# Patient Record
Sex: Male | Born: 1965 | Race: White | Hispanic: No | Smoking: Never smoker
Health system: Southern US, Community
[De-identification: ages and names within clinical notes are randomized; demographics above are authoritative.]

## PROBLEM LIST (undated history)

## (undated) HISTORY — PX: BACK SURGERY: SHX140

---

## 1999-06-18 ENCOUNTER — Encounter: Payer: Self-pay | Admitting: Neurosurgery

## 1999-06-18 ENCOUNTER — Inpatient Hospital Stay (HOSPITAL_COMMUNITY): Admission: RE | Admit: 1999-06-18 | Discharge: 1999-06-21 | Payer: Self-pay | Admitting: Neurosurgery

## 2000-05-02 ENCOUNTER — Encounter: Payer: Self-pay | Admitting: Neurosurgery

## 2000-05-02 ENCOUNTER — Ambulatory Visit (HOSPITAL_COMMUNITY): Admission: RE | Admit: 2000-05-02 | Discharge: 2000-05-02 | Payer: Self-pay | Admitting: Neurosurgery

## 2004-12-15 ENCOUNTER — Ambulatory Visit: Payer: Self-pay | Admitting: Pain Medicine

## 2005-02-16 ENCOUNTER — Ambulatory Visit: Payer: Self-pay | Admitting: Pain Medicine

## 2006-01-11 ENCOUNTER — Ambulatory Visit: Payer: Self-pay | Admitting: Urology

## 2014-09-16 ENCOUNTER — Emergency Department: Payer: Self-pay | Admitting: Internal Medicine

## 2014-10-05 ENCOUNTER — Emergency Department: Payer: Self-pay | Admitting: Student

## 2014-10-05 LAB — COMPREHENSIVE METABOLIC PANEL
Albumin: 3.7 g/dL (ref 3.4–5.0)
Alkaline Phosphatase: 78 U/L
Anion Gap: 5 — ABNORMAL LOW (ref 7–16)
BUN: 11 mg/dL (ref 7–18)
Bilirubin,Total: 0.3 mg/dL (ref 0.2–1.0)
Calcium, Total: 8.5 mg/dL (ref 8.5–10.1)
Chloride: 105 mmol/L (ref 98–107)
Co2: 29 mmol/L (ref 21–32)
Creatinine: 1.09 mg/dL (ref 0.60–1.30)
EGFR (African American): >60
EGFR (Non-African Amer.): >60
Glucose: 96 mg/dL (ref 65–99)
Osmolality: 277 (ref 275–301)
Potassium: 3.6 mmol/L (ref 3.5–5.1)
SGOT(AST): 22 U/L (ref 15–37)
SGPT (ALT): 39 U/L
Sodium: 139 mmol/L (ref 136–145)
Total Protein: 6.6 g/dL (ref 6.4–8.2)

## 2014-10-05 LAB — CBC
HCT: 43.8 % (ref 40.0–52.0)
HGB: 14.4 g/dL (ref 13.0–18.0)
MCH: 30.7 pg (ref 26.0–34.0)
MCHC: 32.9 g/dL (ref 32.0–36.0)
MCV: 93 fL (ref 80–100)
Platelet: 192 10*3/uL (ref 150–440)
RBC: 4.69 10*6/uL (ref 4.40–5.90)
RDW: 13 % (ref 11.5–14.5)
WBC: 5 10*3/uL (ref 3.8–10.6)

## 2014-10-05 LAB — TROPONIN I: Troponin-I: <0.02 ng/mL
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
## 2016-03-01 ENCOUNTER — Encounter: Payer: Self-pay | Admitting: *Deleted

## 2016-03-17 ENCOUNTER — Ambulatory Visit: Payer: Self-pay | Admitting: General Surgery

## 2016-03-29 ENCOUNTER — Encounter: Payer: Self-pay | Admitting: General Surgery

## 2016-03-29 ENCOUNTER — Ambulatory Visit (INDEPENDENT_AMBULATORY_CARE_PROVIDER_SITE_OTHER): Payer: BC Managed Care – PPO | Admitting: General Surgery

## 2016-03-29 ENCOUNTER — Ambulatory Visit (INDEPENDENT_AMBULATORY_CARE_PROVIDER_SITE_OTHER): Payer: BC Managed Care – PPO

## 2016-03-29 ENCOUNTER — Ambulatory Visit
Admission: EM | Admit: 2016-03-29 | Discharge: 2016-03-29 | Disposition: A | Discharge status: Home / Self Care | Payer: BC Managed Care – PPO | Attending: Family Medicine | Admitting: Family Medicine

## 2016-03-29 ENCOUNTER — Encounter: Payer: Self-pay | Admitting: Emergency Medicine

## 2016-03-29 VITALS — BP 150/92 | HR 80 | Resp 14 | Ht 72.0 in | Wt 216.0 lb

## 2016-03-29 DIAGNOSIS — M19079 Primary osteoarthritis, unspecified ankle and foot: Secondary | ICD-10-CM

## 2016-03-29 DIAGNOSIS — M1990 Unspecified osteoarthritis, unspecified site: Secondary | ICD-10-CM

## 2016-03-29 DIAGNOSIS — M7662 Achilles tendinitis, left leg: Secondary | ICD-10-CM

## 2016-03-29 DIAGNOSIS — M7732 Calcaneal spur, left foot: Secondary | ICD-10-CM | POA: Diagnosis not present

## 2016-03-29 DIAGNOSIS — Z1211 Encounter for screening for malignant neoplasm of colon: Secondary | ICD-10-CM

## 2016-03-29 MED ORDER — ACETAMINOPHEN 500 MG PO TABS: 1000.0000 mg | ORAL_TABLET | Freq: Four times a day (QID) | ORAL | Status: AC | PRN

## 2016-03-29 MED ORDER — POLYETHYLENE GLYCOL 3350 17 GM/SCOOP PO POWD: ORAL | Status: DC

## 2016-03-29 NOTE — Patient Instructions (Addendum)
Colonoscopy A colonoscopy is an exam to look at the entire large intestine (colon). This exam can help find problems such as tumors, polyps, inflammation, and areas of bleeding. The exam takes about 1 hour.  [facility] KNOW ABOUT:   Any allergies you have.  All medicines you are taking, including vitamins, herbs, eye drops, creams, and over-the-counter medicines.  Previous problems you or members of your family have had with the use of anesthetics.  Any blood disorders you have.  Previous surgeries you have had.  Medical conditions you have. RISKS AND COMPLICATIONS  Generally, this is a safe procedure. However, as with any procedure, complications can occur. Possible complications include:  Bleeding.  Tearing or rupture of the colon wall.  Reaction to medicines given during the exam.  Infection (rare). BEFORE THE PROCEDURE   Ask your health care provider about changing or stopping your regular medicines.  You may be prescribed an oral bowel prep. This involves drinking a large amount of medicated liquid, starting the day before your procedure. The liquid will cause you to have multiple loose stools until your stool is almost clear or light green. This cleans out your colon in preparation for the procedure.  Do not eat or drink anything else once you have started the bowel prep, unless your health care provider tells you it is safe to do so.  Arrange for someone to drive you home after the procedure. PROCEDURE   You will be given medicine to help you relax (sedative).  You will lie on your side with your knees bent.  A long, flexible tube with a light and camera on the end (colonoscope) will be inserted through the rectum and into the colon. The camera sends video back to a computer screen as it moves through the colon. The colonoscope also releases carbon dioxide gas to inflate the colon. This helps your health care provider see the area better.  During  the exam, your health care provider may take a small tissue sample (biopsy) to be examined under a microscope if any abnormalities are found.  The exam is finished when the entire colon has been viewed. AFTER THE PROCEDURE   Do not drive for 24 hours after the exam.  You may have a small amount of blood in your stool.  You may pass moderate amounts of gas and have mild abdominal cramping or bloating. This is caused by the gas used to inflate your colon during the exam.  Ask when your test results will be ready and how you will get your results. Make sure you get your test results.   This information is not intended to replace advice given to you by your health care provider. Make sure you discuss any questions you have with your health care provider.   Document Released: 10/22/2000 Document Revised: 08/15/2013 Document Reviewed: 07/02/2013 [employer] Interactive Patient Education [employer]  [facility].

## 2016-03-29 NOTE — Discharge Instructions (Signed)
Achilles Tendinitis Achilles tendinitis is inflammation of the tough, cord-like band that attaches the lower muscles of your leg to your heel (Achilles tendon). It is usually caused by overusing the tendon and joint involved.  CAUSES Achilles tendinitis can happen because of:  A sudden increase in exercise or activity (such as running).  Doing the same exercises or activities (such as jumping) over and over.  Not warming up calf muscles before exercising.  Exercising in shoes that are worn out or not made for exercise.  Having arthritis or a bone growth on the back of the heel bone. This can rub against the tendon and hurt the tendon. SIGNS AND SYMPTOMS The most common symptoms are:  Pain in the back of the leg, just above the heel. The pain usually gets worse with exercise and better with rest.  Stiffness or soreness in the back of the leg, especially in the morning.  Swelling of the skin over the Achilles tendon.  Trouble standing on tiptoe. Sometimes, an Achilles tendon tears (ruptures). Symptoms of an Achilles tendon rupture can include:  Sudden, severe pain in the back of the leg.  Trouble putting weight on the foot or walking normally. DIAGNOSIS Achilles tendinitis will be diagnosed based on symptoms and a physical examination. An X-ray may be done to check if another condition is causing your symptoms. An MRI may be ordered if your health care provider suspects you may have completely torn your tendon, which is called an Achilles tendon rupture.  TREATMENT  Achilles tendinitis usually gets better over time. It can take weeks to months to heal completely. Treatment focuses on treating the symptoms and helping the injury heal. HOME CARE INSTRUCTIONS   Rest your Achilles tendon and avoid activities that cause pain.  Apply ice to the injured area:  Put ice in a plastic bag.  Place a towel between your skin and the bag.  Leave the ice on for 20 minutes, 2-3 times a  day  Try to avoid using the tendon (other than gentle range of motion) while the tendon is painful. Do not resume use until instructed by your health care provider. Then begin use gradually. Do not increase use to the point of pain. If pain does develop, decrease use and continue the above measures. Gradually increase activities that do not cause discomfort until you achieve normal use.  Do exercises to make your calf muscles stronger and more flexible. Your health care provider or physical therapist can recommend exercises for you to do.  Wrap your ankle with an elastic bandage or other wrap. This can help keep your tendon from moving too much. Your health care provider will show you how to wrap your ankle correctly.  Only take over-the-counter or prescription medicines for pain, discomfort, or fever as directed by your health care provider. SEEK MEDICAL CARE IF:   Your pain and swelling increase or pain is uncontrolled with medicines.  You develop new, unexplained symptoms or your symptoms get worse.  You are unable to move your toes or foot.  You develop warmth and swelling in your foot.  You have an unexplained temperature. MAKE SURE YOU:   Understand these instructions.  Will watch your condition.  Will get help right away if you are not doing well or get worse.   This information is not intended to replace advice given to you by your health care provider. Make sure you discuss any questions you have with your health care provider.   Document Released:  08/04/2005 Document Revised: 11/15/2014 Document Reviewed: 06/06/2013 Elsevier Interactive Patient Education 2016 [employer]  Achilles Tendinitis With Rehab Achilles tendinitis is a disorder of the Achilles tendon. The Achilles tendon connects the large calf muscles (Gastrocnemius and Soleus) to the heel bone (calcaneus). This tendon is sometimes called the heel cord. It is important for pushing-off and standing on your toes  and is important for walking, running, or jumping. Tendinitis is often caused by overuse and repetitive microtrauma. SYMPTOMS  Pain, tenderness, swelling, warmth, and redness may occur over the Achilles tendon even at rest.  Pain with pushing off, or flexing or extending the ankle.  Pain that is worsened after or during activity. CAUSES   Overuse sometimes seen with rapid increase in exercise programs or in sports requiring running and jumping.  Poor physical conditioning (strength and flexibility or endurance).  Running sports, especially training running down hills.  Inadequate warm-up before practice or play or failure to stretch before participation.  Injury to the tendon. PREVENTION   Warm up and stretch before practice or competition.  Allow time for adequate rest and recovery between practices and competition.  Keep up conditioning.  Keep up ankle and leg flexibility.  Improve or keep muscle strength and endurance.  Improve cardiovascular fitness.  Use proper technique.  Use proper equipment (shoes, skates).  To help prevent recurrence, taping, protective strapping, or an adhesive bandage may be recommended for several weeks after healing is complete. PROGNOSIS   Recovery may take weeks to several months to heal.  Longer recovery is expected if symptoms have been prolonged.  Recovery is usually quicker if the inflammation is due to a direct blow as compared with overuse or sudden strain. RELATED COMPLICATIONS   Healing time will be prolonged if the condition is not correctly treated. The injury must be given plenty of time to heal.  Symptoms can reoccur if activity is resumed too soon.  Untreated, tendinitis may increase the risk of tendon rupture requiring additional time for recovery and possibly surgery. TREATMENT   The first treatment consists of rest anti-inflammatory medication, and ice to relieve the pain.  Stretching and strengthening exercises  after resolution of pain will likely help reduce the risk of recurrence. Referral to a physical therapist or athletic trainer for further evaluation and treatment may be helpful.  A walking boot or cast may be recommended to rest the Achilles tendon. This can help break the cycle of inflammation and microtrauma.  Arch supports (orthotics) may be prescribed or recommended by your caregiver as an adjunct to therapy and rest.  Surgery to remove the inflamed tendon lining or degenerated tendon tissue is rarely necessary and has shown less than predictable results. MEDICATION   Nonsteroidal anti-inflammatory medications, such as aspirin and ibuprofen, may be used for pain and inflammation relief. Do not take within 7 days before surgery. Take these as directed by your caregiver. Contact your caregiver immediately if any bleeding, stomach upset, or signs of allergic reaction occur. Other minor pain relievers, such as acetaminophen, may also be used.  Pain relievers may be prescribed as necessary by your caregiver. Do not take prescription pain medication for longer than 4 to 7 days. Use only as directed and only as much as you need.  Cortisone injections are rarely indicated. Cortisone injections may weaken tendons and predispose to rupture. It is better to give the condition more time to heal than to use them. HEAT AND COLD  Cold is used to relieve pain and reduce inflammation  for acute and chronic Achilles tendinitis. Cold should be applied for 10 to 15 minutes every 2 to 3 hours for inflammation and pain and immediately after any activity that aggravates your symptoms. Use ice packs or an ice massage.  Heat may be used before performing stretching and strengthening activities prescribed by your caregiver. Use a heat pack or a warm soak. SEEK MEDICAL CARE IF:  Symptoms get worse or do not improve in 2 weeks despite treatment.  New, unexplained symptoms develop. Drugs used in treatment may produce  side effects. EXERCISES RANGE OF MOTION (ROM) AND STRETCHING EXERCISES - Achilles Tendinitis  These exercises may help you when beginning to rehabilitate your injury. Your symptoms may resolve with or without further involvement from your physician, physical therapist or athletic trainer. While completing these exercises, remember:   Restoring tissue flexibility helps normal motion to return to the joints. This allows healthier, less painful movement and activity.  An effective stretch should be held for at least 30 seconds.  A stretch should never be painful. You should only feel a gentle lengthening or release in the stretched tissue. STRETCH - Gastroc, Standing   Place hands on wall.  Extend right / left leg, keeping the front knee somewhat bent.  Slightly point your toes inward on your back foot.  Keeping your right / left heel on the floor and your knee straight, shift your weight toward the wall, not allowing your back to arch.  You should feel a gentle stretch in the right / left calf. Hold this position for __________ seconds. Repeat __________ times. Complete this stretch __________ times per day. STRETCH - Soleus, Standing   Place hands on wall.  Extend right / left leg, keeping the other knee somewhat bent.  Slightly point your toes inward on your back foot.  Keep your right / left heel on the floor, bend your back knee, and slightly shift your weight over the back leg so that you feel a gentle stretch deep in your back calf.  Hold this position for __________ seconds. Repeat __________ times. Complete this stretch __________ times per day. STRETCH - Gastrocsoleus, Standing  Note: This exercise can place a lot of stress on your foot and ankle. Please complete this exercise only if specifically instructed by your caregiver.   Place the ball of your right / left foot on a step, keeping your other foot firmly on the same step.  Hold on to the wall or a rail for  balance.  Slowly lift your other foot, allowing your body weight to press your heel down over the edge of the step.  You should feel a stretch in your right / left calf.  Hold this position for __________ seconds.  Repeat this exercise with a slight bend in your knee. Repeat __________ times. Complete this stretch __________ times per day.  STRENGTHENING EXERCISES - Achilles Tendinitis These exercises may help you when beginning to rehabilitate your injury. They may resolve your symptoms with or without further involvement from your physician, physical therapist or athletic trainer. While completing these exercises, remember:   Muscles can gain both the endurance and the strength needed for everyday activities through controlled exercises.  Complete these exercises as instructed by your physician, physical therapist or athletic trainer. Progress the resistance and repetitions only as guided.  You may experience muscle soreness or fatigue, but the pain or discomfort you are trying to eliminate should never worsen during these exercises. If this pain does worsen, stop and  make certain you are following the directions exactly. If the pain is still present after adjustments, discontinue the exercise until you can discuss the trouble with your clinician. STRENGTH - Plantar-flexors   Sit with your right / left leg extended. Holding onto both ends of a rubber exercise band/tubing, loop it around the ball of your foot. Keep a slight tension in the band.  Slowly push your toes away from you, pointing them downward.  Hold this position for __________ seconds. Return slowly, controlling the tension in the band/tubing. Repeat __________ times. Complete this exercise __________ times per day.  STRENGTH - Plantar-flexors   Stand with your feet shoulder width apart. Steady yourself with a wall or table using as little support as needed.  Keeping your weight evenly spread over the width of your feet,  rise up on your toes.*  Hold this position for __________ seconds. Repeat __________ times. Complete this exercise __________ times per day.  *If this is too easy, shift your weight toward your right / left leg until you feel challenged. Ultimately, you may be asked to do this exercise with your right / left foot only. STRENGTH - Plantar-flexors, Eccentric  Note: This exercise can place a lot of stress on your foot and ankle. Please complete this exercise only if specifically instructed by your caregiver.   Place the balls of your feet on a step. With your hands, use only enough support from a wall or rail to keep your balance.  Keep your knees straight and rise up on your toes.  Slowly shift your weight entirely to your right / left toes and pick up your opposite foot. Gently and with controlled movement, lower your weight through your right / left foot so that your heel drops below the level of the step. You will feel a slight stretch in the back of your calf at the end position.  Use the healthy leg to help rise up onto the balls of both feet, then lower weight only on the right / left leg again. Build up to 15 repetitions. Then progress to 3 consecutive sets of 15 repetitions.*  After completing the above exercise, complete the same exercise with a slight knee bend (about 30 degrees). Again, build up to 15 repetitions. Then progress to 3 consecutive sets of 15 repetitions.* Perform this exercise __________ times per day.  *When you easily complete 3 sets of 15, your physician, physical therapist or athletic trainer may advise you to add resistance by wearing a backpack filled with additional weight. STRENGTH - Plantar Flexors, Seated   Sit on a chair that allows your feet to rest flat on the ground. If necessary, sit at the edge of the chair.  Keeping your toes firmly on the ground, lift your right / left heel as far as you can without increasing any discomfort in your ankle. Repeat  __________ times. Complete this exercise __________ times a day. *If instructed by your physician, physical therapist or athletic trainer, you may add ____________________ of resistance by placing a weighted object on your right / left knee.   This information is not intended to replace advice given to you by your health care provider. Make sure you discuss any questions you have with your health care provider.   Document Released: 05/26/2005 Document Revised: 11/15/2014 Document Reviewed: 02/06/2009 Elsevier Interactive Patient Education 2016 [employer] Arthritis Arthritis is a term that is commonly used to refer to joint pain or joint disease. There are more than 100 types  of arthritis. CAUSES The most common cause of this condition is wear and tear of a joint. Other causes include:  Gout.  Inflammation of a joint.  An infection of a joint.  Sprains and other injuries near the joint.  A drug reaction or allergic reaction. In some cases, the cause may not be known. SYMPTOMS The main symptom of this condition is pain in the joint with movement. Other symptoms include:  Redness, swelling, or stiffness at a joint.  Warmth coming from the joint.  Fever.  Overall feeling of illness. DIAGNOSIS This condition may be diagnosed with a physical exam and tests, including:  Blood tests.  Urine tests.  Imaging tests, such as MRI, X-rays, or a CT scan. Sometimes, fluid is removed from a joint for testing. TREATMENT Treatment for this condition may involve:  Treatment of the cause, if it is known.  Rest.  Raising (elevating) the joint.  Applying cold or hot packs to the joint.  Medicines to improve symptoms and reduce inflammation.  Injections of a steroid such as cortisone into the joint to help reduce pain and inflammation. Depending on the cause of your arthritis, you may need to make lifestyle changes to reduce stress on your joint. These changes may include  exercising more and losing weight. HOME CARE INSTRUCTIONS Medicines  Take over-the-counter and prescription medicines only as told by your health care provider.  Do not take aspirin to relieve pain if gout is suspected. Activities  Rest your joint if told by your health care provider. Rest is important when your disease is active and your joint feels painful, swollen, or stiff.  Avoid activities that make the pain worse. It is important to balance activity with rest.  Exercise your joint regularly with range-of-motion exercises as told by your health care provider. Try doing low-impact exercise, such as:  Swimming.  Water aerobics.  Biking.  Walking. Joint Care  If your joint is swollen, keep it elevated if told by your health care provider.  If your joint feels stiff in the morning, try taking a warm shower.  If directed, apply heat to the joint. If you have diabetes, do not apply heat without permission from your health care provider.  Put a towel between the joint and the hot pack or heating pad.  Leave the heat on the area for 20-30 minutes.  If directed, apply ice to the joint:  Put ice in a plastic bag.  Place a towel between your skin and the bag.  Leave the ice on for 20 minutes, 2-3 times per day.  Keep all follow-up visits as told by your health care provider. This is important. SEEK MEDICAL CARE IF:  The pain gets worse.  You have a fever. SEEK IMMEDIATE MEDICAL CARE IF:  You develop severe joint pain, swelling, or redness.  Many joints become painful and swollen.  You develop severe back pain.  You develop severe weakness in your leg.  You cannot control your bladder or bowels.   This information is not intended to replace advice given to you by your health care provider. Make sure you discuss any questions you have with your health care provider.   Document Released: 12/02/2004 Document Revised: 07/16/2015 Document Reviewed:  01/20/2015 Elsevier Interactive Patient Education 2016 [employer] Heel Spur A heel spur is a bony growth that forms on the bottom of your heel bone (calcaneus). Heel spurs are common and do not always cause pain. However, heel spurs often cause inflammation in the  strong band of tissue that runs underneath the bone of your foot (plantar fascia). When this happens, you may feel pain on the bottom of your foot, near your heel.  CAUSES  The cause of heel spurs is not completely understood. They may be caused by pressure on the heel. Or, they may stem from the muscle attachments (tendons) near the spur pulling on the heel.  RISK FACTORS You may be at risk for a heel spur if you:  Are older than 40.  Are overweight.  Have wear and tear arthritis (osteoarthritis).  Have plantar fascia inflammation. SIGNS AND SYMPTOMS  Some people have heel spurs but no symptoms. If you do have symptoms, they may include:   Pain in the bottom of your heel.  Pain that is worse when you first get out of bed.  Pain that gets worse after walking or standing. DIAGNOSIS  Your health care provider may diagnose a heel spur based on your symptoms and a physical exam. You may also have an X-ray of your foot to check for a bony growth coming from the calcaneus.  TREATMENT Treatment aims to relieve the pain from the heel spur. This may include:  Stretching exercises.  Losing weight.  Wearing specific shoes, inserts, or orthotics for comfort and support.  Wearing splints at night to properly position your feet.  Taking over-the-counter medicine to relieve pain.  Being treated with high-intensity sound waves to break up the heel spur (extracorporeal shock wave therapy).  Getting steroid injections in your heel to reduce swelling and ease pain.  Having surgery if your heel spur causes long-term (chronic) pain. HOME CARE INSTRUCTIONS   Take medicines only as directed by your health care provider.  Ask  your health care provider if you should use ice or cold packs on the painful areas of your heel or foot.  Avoid activities that cause you pain until you recover or as directed by your health care provider.  Stretch before exercising or being physically active.  Wear supportive shoes that fit well as directed by your health care provider. You might need to buy new shoes. Wearing old shoes or shoes that do not fit correctly may not provide the support that you need.  Lose weight if your health care provider thinks you should. This can relieve pressure on your foot that may be causing pain and discomfort. SEEK MEDICAL CARE IF:   Your pain continues or gets worse.   This information is not intended to replace advice given to you by your health care provider. Make sure you discuss any questions you have with your health care provider.   Document Released: 12/01/2005 Document Revised: 11/15/2014 Document Reviewed: 12/26/2013 Elsevier Interactive Patient Education 2016 [employer] Cryotherapy Cryotherapy means treatment with cold. Ice or gel packs can be used to reduce both pain and swelling. Ice is the most helpful within the first 24 to 48 hours after an injury or flare-up from overusing a muscle or joint. Sprains, strains, spasms, burning pain, shooting pain, and aches can all be eased with ice. Ice can also be used when recovering from surgery. Ice is effective, has very few side effects, and is safe for most people to use. PRECAUTIONS  Ice is not a safe treatment option for people with:  Raynaud phenomenon. This is a condition affecting small blood vessels in the extremities. Exposure to cold may cause your problems to return.  Cold hypersensitivity. There are many forms of cold hypersensitivity, including:  Cold urticaria.  Red, itchy hives appear on the skin when the tissues begin to warm after being iced.  Cold erythema. This is a red, itchy rash caused by exposure to cold.  Cold  hemoglobinuria. Red blood cells break down when the tissues begin to warm after being iced. The hemoglobin that carry oxygen are passed into the urine because they cannot combine with blood proteins fast enough.  Numbness or altered sensitivity in the area being iced. If you have any of the following conditions, do not use ice until you have discussed cryotherapy with your caregiver:  Heart conditions, such as arrhythmia, angina, or chronic heart disease.  High blood pressure.  Healing wounds or open skin in the area being iced.  Current infections.  Rheumatoid arthritis.  Poor circulation.  Diabetes. Ice slows the blood flow in the region it is applied. This is beneficial when trying to stop inflamed tissues from spreading irritating chemicals to surrounding tissues. However, if you expose your skin to cold temperatures for too long or without the proper protection, you can damage your skin or nerves. Watch for signs of skin damage due to cold. HOME CARE INSTRUCTIONS Follow these tips to use ice and cold packs safely.  Place a dry or damp towel between the ice and skin. A damp towel will cool the skin more quickly, so you may need to shorten the time that the ice is used.  For a more rapid response, add gentle compression to the ice.  Ice for no more than 10 to 20 minutes at a time. The bonier the area you are icing, the less time it will take to get the benefits of ice.  Check your skin after 5 minutes to make sure there are no signs of a poor response to cold or skin damage.  Rest 20 minutes or more between uses.  Once your skin is numb, you can end your treatment. You can test numbness by very lightly touching your skin. The touch should be so light that you do not see the skin dimple from the pressure of your fingertip. When using ice, most people will feel these normal sensations in this order: cold, burning, aching, and numbness.  Do not use ice on someone who cannot  communicate their responses to pain, such as small children or people with dementia. HOW TO MAKE AN ICE PACK Ice packs are the most common way to use ice therapy. Other methods include ice massage, ice baths, and cryosprays. Muscle creams that cause a cold, tingly feeling do not offer the same benefits that ice offers and should not be used as a substitute unless recommended by your caregiver. To make an ice pack, do one of the following:  Place crushed ice or a bag of frozen vegetables in a sealable plastic bag. Squeeze out the excess air. Place this bag inside another plastic bag. Slide the bag into a pillowcase or place a damp towel between your skin and the bag.  Mix 3 parts water with 1 part rubbing alcohol. Freeze the mixture in a sealable plastic bag. When you remove the mixture from the freezer, it will be slushy. Squeeze out the excess air. Place this bag inside another plastic bag. Slide the bag into a pillowcase or place a damp towel between your skin and the bag. SEEK MEDICAL CARE IF:  You develop white spots on your skin. This may give the skin a blotchy (mottled) appearance.  Your skin turns blue or pale.  Your skin becomes waxy  or hard.  Your swelling gets worse. MAKE SURE YOU:   Understand these instructions.  Will watch your condition.  Will get help right away if you are not doing well or get worse.   This information is not intended to replace advice given to you by your health care provider. Make sure you discuss any questions you have with your health care provider.   Document Released: 06/21/2011 Document Revised: 11/15/2014 Document Reviewed: 06/21/2011 Elsevier Interactive Patient Education 2016 Elsevier Inc. Medial Epicondylitis With Rehab Medial epicondylitis involves inflammation and pain around the inner (medial) portion of the elbow. This pain is caused by inflammation of the tendons in the forearm that flex (bring down) the wrist. Medial epicondylitis is  also called golfer's elbow, because it is common among golfers. However, it may occur in any individual who flexes the wrist regularly. If medial epicondylitis is left untreated, it may become a chronic problem. SYMPTOMS   Pain, tenderness, or inflammation over the inner (medial) side of the elbow.  Pain or weakness with gripping activities.  Pain that increases with wrist twisting motions (using a screwdriver, playing golf, bowling). CAUSES  Medial epicondylitis is caused by inflammation of the tendons that flex the wrist. Causes of injury may include:  Chronic, repetitive stress and strain to the tendons that run from the wrist and forearm to the elbow.  Sudden strain on the forearm, including wrist snap when serving balls with racquet sports, or throwing a baseball. RISK INCREASES WITH:  Sports or occupations that require repetitive and/or strenuous forearm and wrist movements (pitching a baseball, golfing, carpentry).  Poor wrist and forearm strength and flexibility.  Failure to warm up properly before activity.  Resuming activity before healing, rehabilitation, and conditioning are complete. PREVENTION   Warm up and stretch properly before activity.  Maintain physical fitness:  Strength, flexibility, and endurance.  Cardiovascular fitness.  Wear and use properly fitted equipment.  Learn and use proper technique and have a coach correct improper technique.  Wear a tennis elbow (counterforce) brace. PROGNOSIS  The course of this condition depends on the degree of the injury. If treated properly, acute cases (symptoms lasting less than 4 weeks) are often resolved in 2 to 6 weeks. Chronic (longer lasting cases) often resolve in 3 to 6 months, but may require physical therapy. RELATED COMPLICATIONS   Frequently recurring symptoms, resulting in a chronic problem. Properly treating the problem the first time decreases frequency of recurrence.  Chronic inflammation, scarring,  and partial tendon tear, requiring surgery.  Delayed healing or resolution of symptoms. TREATMENT  Treatment first involves the use of ice and medicine, to reduce pain and inflammation. Strengthening and stretching exercises may reduce discomfort, if performed regularly. These exercises may be performed at home, if the condition is an acute injury. Chronic cases may require a referral to a physical therapist for evaluation and treatment. Your caregiver may advise a corticosteroid injection to help reduce inflammation. Rarely, surgery is needed. MEDICATION  If pain medicine is needed, nonsteroidal anti-inflammatory medicines (aspirin and ibuprofen), or other minor pain relievers (acetaminophen), are often advised.  Do not take pain medicine for 7 days before surgery.  Prescription pain relievers may be given, if your caregiver thinks they are needed. Use only as directed and only as much as you need.  Corticosteroid injections may be recommended. These injections should be reserved only for the most severe cases, because they can only be given a certain number of times. HEAT AND COLD  Cold treatment (icing)  should be applied for 10 to 15 minutes every 2 to 3 hours for inflammation and pain, and immediately after activity that aggravates your symptoms. Use ice packs or an ice massage.  Heat treatment may be used before performing stretching and strengthening activities prescribed by your caregiver, physical therapist, or athletic trainer. Use a heat pack or a warm water soak. SEEK MEDICAL CARE IF: Symptoms get worse or do not improve in 2 weeks, despite treatment. EXERCISES  RANGE OF MOTION (ROM) AND STRETCHING EXERCISES - Epicondylitis, Medial (Golfer's Elbow) These exercises may help you when beginning to rehabilitate your injury. Your symptoms may go away with or without further involvement from your physician, physical therapist or athletic trainer. While completing these exercises,  remember:   Restoring tissue flexibility helps normal motion to return to the joints. This allows healthier, less painful movement and activity.  An effective stretch should be held for at least 30 seconds.  A stretch should never be painful. You should only feel a gentle lengthening or release in the stretched tissue. RANGE OF MOTION - Wrist Flexion, Active-Assisted  Extend your right / left elbow with your fingers pointing down.*  Gently pull the back of your hand towards you, until you feel a gentle stretch on the top of your forearm.  Hold this position for __________ seconds. Repeat __________ times. Complete this exercise __________ times per day.  *If directed by your physician, physical therapist or athletic trainer, complete this stretch with your elbow bent, rather than extended. RANGE OF MOTION - Wrist Extension, Active-Assisted  Extend your right / left elbow and turn your palm upwards.*  Gently pull your palm and fingertips back, so your wrist extends and your fingers point more toward the ground.  You should feel a gentle stretch on the inside of your forearm.  Hold this position for __________ seconds. Repeat __________ times. Complete this exercise __________ times per day. *If directed by your physician, physical therapist or athletic trainer, complete this stretch with your elbow bent, rather than extended. STRETCH - Wrist Extension   Place your right / left fingertips on a tabletop leaving your elbow slightly bent. Your fingers should point backwards.  Gently press your fingers and palm down onto the table, by straightening your elbow. You should feel a stretch on the inside of your forearm.  Hold this position for __________ seconds. Repeat __________ times. Complete this stretch __________ times per day.  STRENGTHENING EXERCISES - Epicondylitis, Medial (Golfer's Elbow) These exercises may help you when beginning to rehabilitate your injury. They may resolve  your symptoms with or without further involvement from your physician, physical therapist or athletic trainer. While completing these exercises, remember:   Muscles can gain both the endurance and the strength needed for everyday activities through controlled exercises.  Complete these exercises as instructed by your physician, physical therapist or athletic trainer. Increase the resistance and repetitions only as guided.  You may experience muscle soreness or fatigue, but the pain or discomfort you are trying to eliminate should never worsen during these exercises. If this pain does get worse, stop and make sure you are following the directions exactly. If the pain is still present after adjustments, discontinue the exercise until you can discuss the trouble with your caregiver. STRENGTH - Wrist Flexors  Sit with your right / left forearm palm-up, and fully supported on a table or countertop. Your elbow should be resting below the height of your shoulder. Allow your wrist to extend over the edge of  the surface.  Loosely holding a __________ weight, or a piece of rubber exercise band or tubing, slowly curl your hand up toward your forearm.  Hold this position for __________ seconds. Slowly lower the wrist back to the starting position in a controlled manner. Repeat __________ times. Complete this exercise __________ times per day.  STRENGTH - Wrist Extensors  Sit with your right / left forearm palm-down and fully supported. Your elbow should be resting below the height of your shoulder. Allow your wrist to extend over the edge of the surface.  Loosely holding a __________ weight, or a piece of rubber exercise band or tubing, slowly curl your hand up toward your forearm.  Hold this position for __________ seconds. Slowly lower the wrist back to the starting position in a controlled manner. Repeat __________ times. Complete this exercise __________ times per day.  STRENGTH - Ulnar  Deviators  Stand with a ____________________ weight in your right / left hand, or sit while holding a rubber exercise band or tubing, with your healthy arm supported on a table or countertop.  Move your wrist so that your pinkie travels toward your forearm and your thumb moves away from your forearm.  Hold this position for __________ seconds and then slowly lower the wrist back to the starting position. Repeat __________ times. Complete this exercise __________ times per day STRENGTH - Grip   Grasp a tennis ball, a dense sponge, or a large, rolled sock in your hand.  Squeeze as hard as you can, without increasing any pain.  Hold this position for __________ seconds. Release your grip slowly. Repeat __________ times. Complete this exercise __________ times per day.  STRENGTH - Forearm Supinators   Sit with your right / left forearm supported on a table, keeping your elbow below shoulder height. Rest your hand over the edge, palm down.  Gently grip a hammer or a soup ladle.  Without moving your elbow, slowly turn your palm and hand upward to a "thumbs-up" position.  Hold this position for __________ seconds. Slowly return to the starting position. Repeat __________ times. Complete this exercise __________ times per day.  STRENGTH - Forearm Pronators  Sit with your right / left forearm supported on a table, keeping your elbow below shoulder height. Rest your hand over the edge, palm up.  Gently grip a hammer or a soup ladle.  Without moving your elbow, slowly turn your palm and hand upward to a "thumbs-up" position.  Hold this position for __________ seconds. Slowly return to the starting position. Repeat __________ times. Complete this exercise __________ times per day.    This information is not intended to replace advice given to you by your health care provider. Make sure you discuss any questions you have with your health care provider.   Document Released: 10/25/2005  Document Revised: 11/15/2014 Document Reviewed: 02/06/2009 Elsevier Interactive Patient Education [employer]

## 2016-03-29 NOTE — Progress Notes (Signed)
Patient ID: George MarcusCoy Kendell Hirt Jr., male   DOB: [birth year], 50 y.o.   MRN: [id number]  Chief Complaint  Patient presents with  . Other    colonoscopy    HPI George MarcusCoy Kendell Calef Jr. is a 50 y.o. male here today for a evaluation of a screening colonoscopy. Patient states no GI problems at this time.  The patient does [employer]   personally reviewed the patient history. HPI  No past medical history on file.  Past Surgical History  Procedure Laterality Date  . Back surgery      No family history on file.  Social History Social History  Substance Use Topics  . Smoking status: Never Smoker   . Smokeless tobacco: None  . Alcohol Use: No    Allergies  Allergen Reactions  . Morphine And Related     Current Outpatient Prescriptions  Medication Sig Dispense Refill  . acetaminophen (TYLENOL) 500 MG tablet Take 2 tablets (1,000 mg total) by mouth every 6 (six) hours as needed for mild pain, moderate pain or headache. 100 tablet 0  . amphetamine-dextroamphetamine (ADDERALL) 20 MG tablet Take 20 mg by mouth 2 (two) times daily.    . clonazePAM (KLONOPIN) 2 MG tablet Take 2 mg by mouth 2 (two) times daily.    Marland Kitchen. gabapentin (NEURONTIN) 800 MG tablet Take 800 mg by mouth 4 (four) times daily.    . propranolol (INDERAL) 20 MG tablet Take 20 mg by mouth 2 (two) times daily.    . polyethylene glycol powder (GLYCOLAX/MIRALAX) powder 255 grams one bottle for colonoscopy prep 255 g 0   No current facility-administered medications for this visit.    Review of Systems Review of Systems  Constitutional: Negative.   Respiratory: Negative.   Cardiovascular: Negative.     Blood pressure 150/92, pulse 80, resp. rate 14, height 6' (1.829 m), weight 216 lb (97.977 kg).  Physical Exam Physical Exam  Constitutional: He is oriented to person, place, and time. He appears well-developed and well-nourished.  Eyes: Conjunctivae are normal. No scleral icterus.  Neck: Neck supple.   Cardiovascular: Normal rate, regular rhythm and normal heart sounds.   Pulmonary/Chest: Effort normal and breath sounds normal.  Abdominal: Soft. Bowel sounds are normal. There is no tenderness.  Lymphadenopathy:    He has no cervical adenopathy.  Neurological: He is alert and oriented to person, place, and time.  Skin: Skin is warm and dry.    Data Reviewed None.   Assessment    Candidate for screening colonoscopy.     Plan         Colonoscopy with possible biopsy/polypectomy prn: Information regarding the procedure, including its potential risks and complications (including but not limited to perforation of the bowel, which may require emergency surgery to repair, and bleeding) was verbally given to the patient. Educational information regarding lower intestinal endoscopy was given to the patient. Written instructions for how to complete the bowel prep using Miralax were provided. The importance of drinking ample fluids to avoid dehydration as a result of the prep emphasized.  Patient has been scheduled for a colonoscopy on 05-19-16 at [facility].  PCP:  Dewaine Oatsate, Denny C This information has been scribed by Ples SpecterJessica Qualls CMA.   Earline MayotteByrnett, Jeffrey W 03/29/2016, 7:16 PM

## 2016-03-29 NOTE — ED Provider Notes (Signed)
CSN: [id number]     Arrival date & time 03/29/16  [id number] History   First MD Initiated Contact with Patient 03/29/16 [phone]     Chief Complaint  Patient presents with  . Foot Pain   (Consider location/radiation/quality/duration/timing/severity/associated sxs/prior Treatment) HPI Comments: Married caucasian male here for evaluation right foot pain started a couple days ago.  Had a couple long days at work walking 2 weeks ago.  Right handed.  PMHx anxiety, Sprained right ankle 2 years ago denied any recent trauma, changes in exercise/work duties regarding lifting/impact.  PSHx low back surgery L5, bilateral elbows  FHx denied cancer parents  Patient is a 50 y.o. male presenting with lower extremity pain. The history is provided by the patient.  Foot Pain This is a new problem. The current episode started more than 2 days ago. The problem occurs constantly. The problem has been gradually worsening. Pertinent negatives include no chest pain, no abdominal pain, no headaches and no shortness of breath. The symptoms are aggravated by walking. The symptoms are relieved by rest. He has tried rest, food and water for the symptoms. The treatment provided mild relief.    History reviewed. No pertinent past medical history. Past Surgical History  Procedure Laterality Date  . Back surgery     History reviewed. No pertinent family history. Social History  Substance Use Topics  . Smoking status: Never Smoker   . Smokeless tobacco: None  . Alcohol Use: No    Review of Systems  Constitutional: Negative for fever, chills, diaphoresis, activity change, appetite change and fatigue.  HENT: Negative for congestion, ear pain and sore throat.   Eyes: Negative for pain and discharge.  Respiratory: Negative for cough, shortness of breath and wheezing.   Cardiovascular: Negative for chest pain and leg swelling.  Gastrointestinal: Negative for nausea, vomiting, abdominal pain, diarrhea, constipation and blood in  stool.  Genitourinary: Negative for dysuria, hematuria and difficulty urinating.  Musculoskeletal: Positive for myalgias, joint swelling, arthralgias and gait problem. Negative for back pain, neck pain and neck stiffness.  Skin: Negative for color change, pallor, rash and wound.  Allergic/Immunologic: Negative for environmental allergies and food allergies.  Neurological: Negative for headaches.  Hematological: Negative for adenopathy. Does not bruise/bleed easily.  Psychiatric/Behavioral: Negative for confusion, sleep disturbance and agitation. The patient is not nervous/anxious.     Allergies  Morphine and related  Home Medications   Prior to Admission medications   Medication Sig Start Date End Date Taking? Authorizing Provider  amphetamine-dextroamphetamine (ADDERALL) 20 MG tablet Take 20 mg by mouth 2 (two) times daily.   Yes Historical Provider, MD  gabapentin (NEURONTIN) 800 MG tablet Take 800 mg by mouth 4 (four) times daily.   Yes Historical Provider, MD  propranolol (INDERAL) 20 MG tablet Take 20 mg by mouth 2 (two) times daily.   Yes Historical Provider, MD  acetaminophen (TYLENOL) 500 MG tablet Take 2 tablets (1,000 mg total) by mouth every 6 (six) hours as needed for mild pain, moderate pain or headache. 03/29/16 04/12/16  Barbaraann Barthelina A Cleland Simkins, NP  clonazePAM (KLONOPIN) 2 MG tablet Take 2 mg by mouth 2 (two) times daily.    Historical Provider, MD  polyethylene glycol powder (GLYCOLAX/MIRALAX) powder 255 grams one bottle for colonoscopy prep 03/29/16   Earline MayotteJeffrey W Byrnett, MD   Meds Ordered and Administered this Visit  Medications - No data to display  BP 151/90 mmHg  Pulse 81  Temp(Src) 97.3 F (36.3 C) (Tympanic)  Resp 16  Ht 6' (  1.829 m)  Wt 215 lb (97.523 kg)  BMI 29.15 kg/m2  SpO2 100% No data found.   Physical Exam  Constitutional: He is oriented to person, place, and time. Vital signs are normal. He appears well-developed and well-nourished.  HENT:  Head:  Normocephalic and atraumatic.  Right Ear: Hearing and external ear normal.  Left Ear: Hearing and external ear normal.  Nose: Nose normal.  Mouth/Throat: Uvula is midline, oropharynx is clear and moist and mucous membranes are normal.  Eyes: Conjunctivae, EOM and lids are normal. Pupils are equal, round, and reactive to light. Right eye exhibits no chemosis and no discharge. Left eye exhibits no chemosis and no discharge. No scleral icterus.  Neck: Trachea normal and normal range of motion. Neck supple.  Cardiovascular: Normal rate, regular rhythm, normal heart sounds and intact distal pulses.   Pulses:      Dorsalis pedis pulses are 2+ on the right side, and 2+ on the left side.       Posterior tibial pulses are 2+ on the right side, and 2+ on the left side.  Pulmonary/Chest: Effort normal and breath sounds normal. No accessory muscle usage. No respiratory distress. He has no decreased breath sounds. He has no wheezes.  Abdominal: Soft.  Musculoskeletal: Normal range of motion. He exhibits edema and tenderness.       Right shoulder: Normal.       Left shoulder: Normal.       Right elbow: He exhibits normal range of motion, no swelling, no effusion, no deformity and no laceration. Tenderness found. Medial epicondyle tenderness noted. No radial head, no lateral epicondyle and no olecranon process tenderness noted.       Left elbow: He exhibits normal range of motion, no swelling, no effusion, no deformity and no laceration. Tenderness found. Medial epicondyle tenderness noted. No radial head, no lateral epicondyle and no olecranon process tenderness noted.       Right hip: Normal.       Left hip: Normal.       Right knee: Normal.       Left knee: Normal.       Right ankle: He exhibits swelling. He exhibits no ecchymosis, no deformity, no laceration and normal pulse. Tenderness. Achilles tendon exhibits pain. Achilles tendon exhibits no defect and normal Thompson's test results.       Cervical  back: Normal.       Left hand: Normal.       Feet:  1+ lateral malleolus nonpitting edema right foot  Neurological: He is alert and oriented to person, place, and time. He has normal strength. He is not disoriented. He displays no atrophy and no tremor. No cranial nerve deficit or sensory deficit. He exhibits normal muscle tone. He displays no seizure activity. Gait abnormal. Coordination normal. GCS eye subscore is 4. GCS verbal subscore is 5. GCS motor subscore is 6.  Limping and prefers not to wear shoe right foot due to pain with shoe wear in heel  Skin: Skin is warm, dry and intact. No abrasion, no bruising, no burn, no ecchymosis, no laceration, no lesion, no petechiae and no rash noted. He is not diaphoretic. No cyanosis or erythema. No pallor. Nails show no clubbing.  Psychiatric: He has a normal mood and affect. His speech is normal and behavior is normal. Judgment and thought content normal. Cognition and memory are normal.    ED Course  Procedures (including critical care time)  Labs Review Labs Reviewed - No data  to display  Imaging Review Dg Foot Complete Right  03/29/2016  CLINICAL DATA:  Bilateral posterior heel pain for 2 weeks. No known injury. EXAM: RIGHT FOOT COMPLETE - 3+ VIEW COMPARISON:  None. FINDINGS: Small posterior calcaneal spur at the Achilles tendon insertion. No acute bony abnormality. Specifically, no fracture, subluxation, or dislocation. Soft tissues are intact. Early joint space narrowing and spurring in the first MTP joint. IMPRESSION: No acute bony abnormality. Small spur along the posterior calcaneus. Electronically Signed   By: Charlett Nose M.D.   On: 03/29/2016 10:17    1030 discussed xray results with patient given copy of radiology report.  Crutches prn to avoid straining other ligaments in foot worsening arthritis/great toe pain due to altering gait. Discussed usual plan of care with patient and discussed we do not perform foot injections at this urgent  care.   Patient verbalized understanding information/instructions, agreed with plan of care and had no further questions at this time.  MDM   1. Tendonitis, Achilles, left   2. Heel spur, left   3. Arthritis of first MTP joint    Patient given [employer] handout on achilles tendonitis, heel spur, toe arthritis, cryotherapy and medial epicondylitis with rehab exercises.  Discussed stretches and icing. Supportive footwear with soft footbed.  Typically wears boots at [employer].  Tylenol  po QID prn avoid motrin/naproxen due to hypertension could further raise blood pressure.   Follow up with PCM if no improvement with discussed care for podiatry referral for formal PT, possible steroid injection discussed sometimes camboot wear required.  Consider new supportive footwear/OTC inserts if shoe treads worn out/greater than 60 year old.  Work excuse x 48 hours given due to pain/limping unable to wear work boots today due to pain.  Work note to allow tennis shoe wear x 7 days during acute flare.  Patient verbalized understanding of instructions, agreed with plan of care and had no further questions at this time.      Barbaraann Barthel, NP 03/29/16 2026

## 2016-03-29 NOTE — ED Notes (Signed)
Patient c/o pain in his right heel of his foot for couple of days.

## 2016-05-17 ENCOUNTER — Telehealth: Payer: Self-pay | Admitting: *Deleted

## 2016-05-17 NOTE — Telephone Encounter (Signed)
Patient contacted today and confirms no medication changes since last office visit. He reports he does have Miralax prescription.   [facility]   Patient reports he lost colonoscopy instructions but these have been faxed to him today per his request. Instructions reviewed verbally by phone today.  This patient will call the office if he has further questions.

## 2016-05-18 ENCOUNTER — Encounter: Payer: Self-pay | Admitting: *Deleted

## 2016-05-19 ENCOUNTER — Ambulatory Visit: Payer: BC Managed Care – PPO | Admitting: Anesthesiology

## 2016-05-19 ENCOUNTER — Encounter
Admission: RE | Disposition: A | Discharge status: Home / Self Care | Payer: Self-pay | Source: Ambulatory Visit | Attending: General Surgery

## 2016-05-19 ENCOUNTER — Encounter: Payer: Self-pay | Admitting: *Deleted

## 2016-05-19 ENCOUNTER — Ambulatory Visit
Admission: RE | Admit: 2016-05-19 | Discharge: 2016-05-19 | Disposition: A | Discharge status: Home / Self Care | Payer: BC Managed Care – PPO | Source: Ambulatory Visit | Attending: General Surgery | Admitting: General Surgery

## 2016-05-19 DIAGNOSIS — K5730 Diverticulosis of large intestine without perforation or abscess without bleeding: Secondary | ICD-10-CM | POA: Insufficient documentation

## 2016-05-19 DIAGNOSIS — Z79899 Other long term (current) drug therapy: Secondary | ICD-10-CM | POA: Diagnosis not present

## 2016-05-19 DIAGNOSIS — Z1211 Encounter for screening for malignant neoplasm of colon: Secondary | ICD-10-CM | POA: Insufficient documentation

## 2016-05-19 DIAGNOSIS — Z885 Allergy status to narcotic agent status: Secondary | ICD-10-CM | POA: Diagnosis not present

## 2016-05-19 DIAGNOSIS — Z9889 Other specified postprocedural states: Secondary | ICD-10-CM | POA: Diagnosis not present

## 2016-05-19 HISTORY — PX: COLONOSCOPY WITH PROPOFOL: SHX5780

## 2016-05-19 SURGERY — COLONOSCOPY WITH PROPOFOL
Anesthesia: General

## 2016-05-19 MED ORDER — LIDOCAINE HCL (CARDIAC) 20 MG/ML IV SOLN
INTRAVENOUS | Status: DC | PRN
  Administered 2016-05-19: 60 mg via INTRAVENOUS

## 2016-05-19 MED ORDER — PROPOFOL 10 MG/ML IV BOLUS
INTRAVENOUS | Status: DC | PRN
  Administered 2016-05-19: 50 mg via INTRAVENOUS
  Administered 2016-05-19: 40 mg via INTRAVENOUS
  Administered 2016-05-19: 50 mg via INTRAVENOUS

## 2016-05-19 MED ORDER — PROPOFOL 500 MG/50ML IV EMUL
INTRAVENOUS | Status: DC | PRN
  Administered 2016-05-19: 160 ug/kg/min via INTRAVENOUS

## 2016-05-19 MED ORDER — MIDAZOLAM HCL 2 MG/2ML IJ SOLN
INTRAMUSCULAR | Status: DC | PRN
  Administered 2016-05-19: 2 mg via INTRAVENOUS

## 2016-05-19 MED ORDER — SODIUM CHLORIDE 0.9 % IV SOLN
INTRAVENOUS | Status: DC
  Administered 2016-05-19: 1000 mL via INTRAVENOUS

## 2016-05-19 NOTE — Anesthesia Preprocedure Evaluation (Signed)
Anesthesia Evaluation  Patient identified by MRN, date of birth, ID band Patient awake    Reviewed: Allergy & Precautions, H&P , NPO status , Patient's Chart, lab work & pertinent test results, reviewed documented beta blocker date and time   History of Anesthesia Complications Negative for: history of anesthetic complications  Airway Mallampati: I  TM Distance: >3 FB Neck ROM: full    Dental no notable dental hx. (+) Teeth Intact   Pulmonary neg pulmonary ROS,    Pulmonary exam normal breath sounds clear to auscultation       Cardiovascular Exercise Tolerance: Good hypertension, (-) angina(-) CAD, (-) Past MI, (-) Cardiac Stents and (-) CABG Normal cardiovascular exam(-) dysrhythmias (-) Valvular Problems/Murmurs Rhythm:regular Rate:Normal     Neuro/Psych negative neurological ROS  negative psych ROS   GI/Hepatic negative GI ROS, Neg liver ROS,   Endo/Other  negative endocrine ROS  Renal/GU negative Renal ROS  negative genitourinary   Musculoskeletal   Abdominal   Peds  Hematology negative hematology ROS (+)   Anesthesia Other Findings History reviewed. No pertinent past medical history.   Reproductive/Obstetrics negative OB ROS                             Anesthesia Physical Anesthesia Plan  ASA: II  Anesthesia Plan: General   Post-op Pain Management:    Induction:   Airway Management Planned:   Additional Equipment:   Intra-op Plan:   Post-operative Plan:   Informed Consent: I have reviewed the patients History and Physical, chart, labs and discussed the procedure including the risks, benefits and alternatives for the proposed anesthesia with the patient or authorized representative who has indicated his/her understanding and acceptance.   Dental Advisory Given  Plan Discussed with: Anesthesiologist, CRNA and Surgeon  Anesthesia Plan Comments:          Anesthesia Quick Evaluation

## 2016-05-19 NOTE — Anesthesia Postprocedure Evaluation (Signed)
Anesthesia Post Note  Patient: George MarcusCoy Kendell Zerbe Jr.  Procedure(s) Performed: Procedure(s) (LRB): COLONOSCOPY WITH PROPOFOL (N/A)  Patient location during evaluation: Endoscopy Anesthesia Type: General Level of consciousness: awake and alert Pain management: pain level controlled Vital Signs Assessment: post-procedure vital signs reviewed and stable Respiratory status: spontaneous breathing, nonlabored ventilation, respiratory function stable and patient connected to nasal cannula oxygen Cardiovascular status: blood pressure returned to baseline and stable Postop Assessment: no signs of nausea or vomiting Anesthetic complications: no    Last Vitals:  Filed Vitals:   05/19/16 0855 05/19/16 0905  BP: 136/88 138/99  Pulse: 64 70  Temp:    Resp: 18 14    Last Pain: There were no vitals filed for this visit.               Lenard SimmerAndrew Oria Klimas

## 2016-05-19 NOTE — H&P (Signed)
[address]George [name] [id number] September 06, 1966     HPI: For screening colonoscopy. Tolerated prep well.    Prescriptions prior to admission  Medication Sig Dispense Refill Last Dose  . amphetamine-dextroamphetamine (ADDERALL) 20 MG tablet Take 20 mg by mouth 2 (two) times daily.   05/19/2016 at 0500  . gabapentin (NEURONTIN) 800 MG tablet Take 800 mg by mouth 4 (four) times daily.   05/19/2016 at 0500  . clonazePAM (KLONOPIN) 2 MG tablet Take 2 mg by mouth 2 (two) times daily.   Taking  . polyethylene glycol powder (GLYCOLAX/MIRALAX) powder 255 grams one bottle for colonoscopy prep 255 g 0   . propranolol (INDERAL) 20 MG tablet Take 20 mg by mouth 2 (two) times daily.   Taking   Allergies  Allergen Reactions  . Morphine And Related    History reviewed. No pertinent past medical history. Past Surgical History  Procedure Laterality Date  . Back surgery     Social History   Social History  . Marital Status: Divorced    Spouse Name: N/A  . Number of Children: N/A  . Years of Education: N/A   Occupational History  . Not on file.   Social History Main Topics  . Smoking status: Never Smoker   . Smokeless tobacco: Not on file  . Alcohol Use: No  . Drug Use: Not on file  . Sexual Activity: Not on file   Other Topics Concern  . Not on file   Social History Narrative   Social History   Social History Narrative     ROS: Negative.     PE: HEENT: Negative. Lungs: Clear. Cardio: RR.  Assessment/Plan:  Proceed with planned endoscopy. Earline MayotteByrnett, George Wall 05/19/2016   Assessment/Plan:  Proceed with planned endoscopy.

## 2016-05-19 NOTE — Transfer of Care (Signed)
Immediate Anesthesia Transfer of Care Note  Patient: George MarcusCoy Kendell Mcfadden Jr.  Procedure(s) Performed: Procedure(s): COLONOSCOPY WITH PROPOFOL (N/A)  Patient Location: Endoscopy Unit  Anesthesia Type:General  Level of Consciousness: awake, alert , oriented and patient cooperative  Airway & Oxygen Therapy: Patient Spontanous Breathing and Patient connected to nasal cannula oxygen  Post-op Assessment: Report given to RN, Post -op Vital signs reviewed and stable and Patient moving all extremities X 4  Post vital signs: Reviewed and stable  Last Vitals:  Filed Vitals:   05/19/16 0737  BP: 149/100  Pulse: 74  Temp: 36.4 C  Resp: 16    Last Pain: There were no vitals filed for this visit.       Complications: No apparent anesthesia complications

## 2016-05-19 NOTE — Op Note (Signed)
[facility] Gastroenterology Patient Name: George LeechCoy Wall Procedure Date: 05/19/2016 8:08 AM MRN: [id number] Account #: [id number] Date of Birth: 05-Dec-1965 Admit Type: Outpatient Age: 50 [facility] Gender: Male Note Status: Finalized Procedure:            Colonoscopy Indications:          Screening for colorectal malignant neoplasm Providers:            Earline MayotteJeffrey W. Byrnett, MD Referring MD:         Jillene Bucksenny C. Arlana Pouchate, MD (Referring MD) Medicines:            Monitored Anesthesia Care Complications:        No immediate complications. Procedure:            Pre-Anesthesia Assessment:                       - Prior to the procedure, a History and Physical was                        performed, and patient medications, allergies and                        sensitivities were reviewed. The patient's tolerance of                        previous anesthesia was reviewed.                       - The risks and benefits of the procedure and the                        sedation options and risks were discussed with the                        patient. All questions were answered and informed                        consent was obtained.                       After obtaining informed consent, the colonoscope was                        passed under direct vision. Throughout the procedure,                        the patient's blood pressure, pulse, and oxygen                        saturations were monitored continuously. The                        Colonoscope was introduced through the anus and                        advanced to the the cecum, identified by appendiceal                        orifice and ileocecal valve. The colonoscopy was  performed without difficulty. The patient tolerated the                        procedure well. The quality of the bowel preparation                        was excellent. Findings:      Many medium-mouthed diverticula were  found in the sigmoid colon.      The retroflexed view of the distal rectum and anal verge was normal and       showed no anal or rectal abnormalities. Impression:           - Diverticulosis in the sigmoid colon.                       - The distal rectum and anal verge are normal on                        retroflexion view.                       - No specimens collected. Recommendation:       - High fiber diet indefinitely.                       - Repeat colonoscopy in 10 years for screening purposes. Procedure Code(s):    --- Professional ---                       [phone], Colonoscopy, flexible; diagnostic, including                        collection of specimen(s) by brushing or washing, when                        performed (separate procedure) Diagnosis Code(s):    --- Professional ---                       Z12.11, Encounter for screening for malignant neoplasm                        of colon                       K57.30, Diverticulosis of large intestine without                        perforation or abscess without bleeding CPT copyright 2016 [employer]. All rights reserved. The codes documented in this report are preliminary and upon coder review may  be revised to meet current compliance requirements. Earline Mayotte, MD 05/19/2016 8:33:06 AM This report has been signed electronically. Number of Addenda: 0 Note Initiated On: 05/19/2016 8:08 AM Scope Withdrawal Time: 0 hours 7 minutes 8 seconds  Total Procedure Duration: 0 hours 12 minutes 20 seconds       [facility]

## 2016-05-23 ENCOUNTER — Encounter: Payer: Self-pay | Admitting: General Surgery

## 2016-09-08 DEATH — deceased

## 2017-06-12 IMAGING — CR DG FOOT COMPLETE 3+V*R*
3 series · 3 of 3 positions shown · non-contrast
Comparison: None.

CLINICAL DATA: Bilateral posterior heel pain for 2 weeks. No known
injury.

EXAM:
RIGHT FOOT COMPLETE - 3+ VIEW

[foot ap]
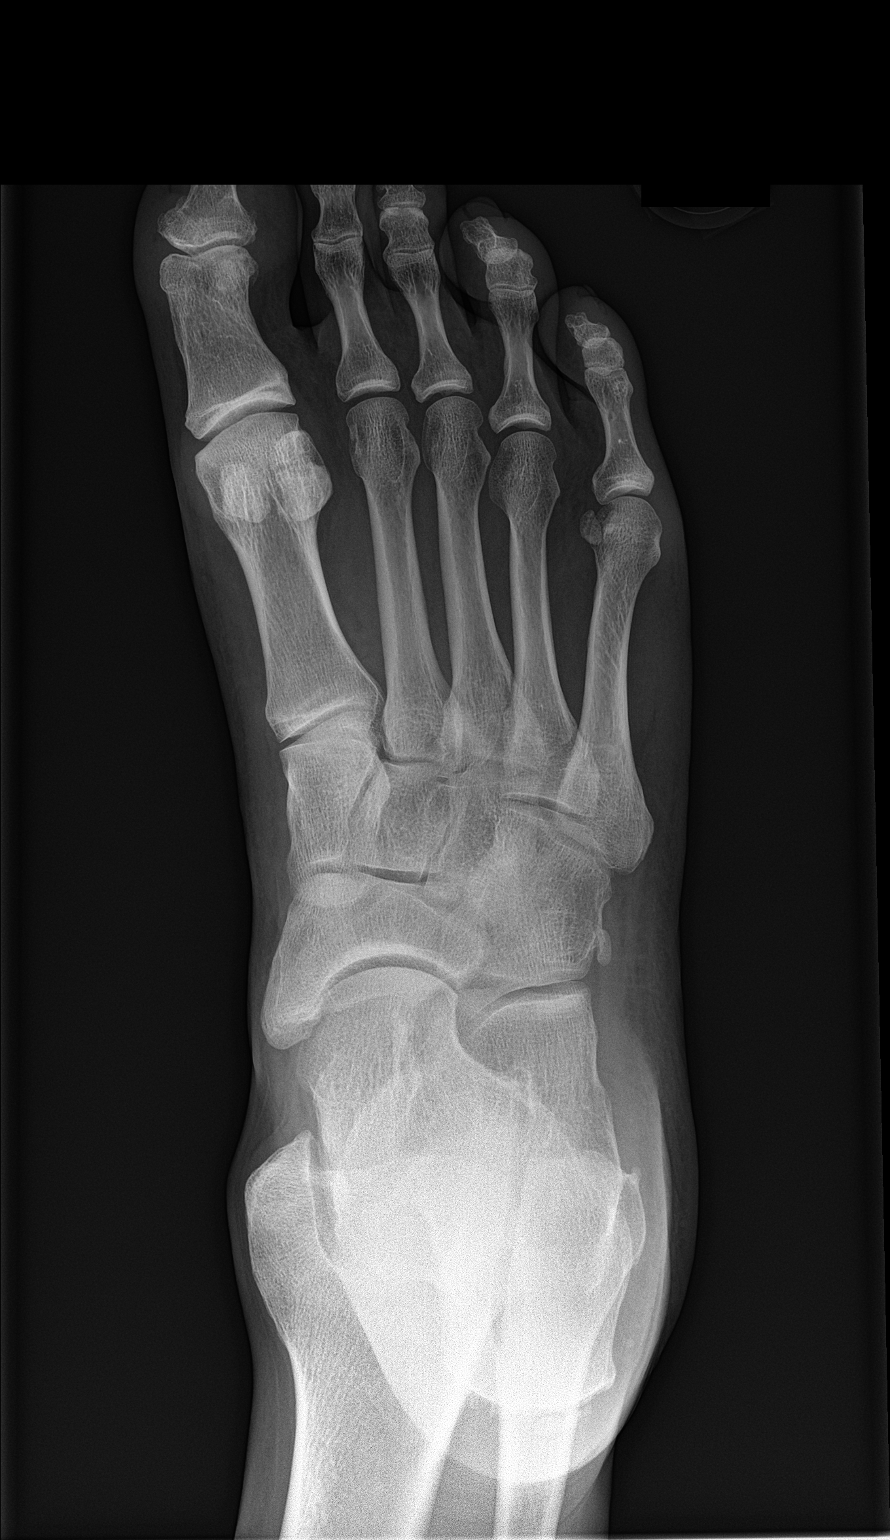

[foot obl]
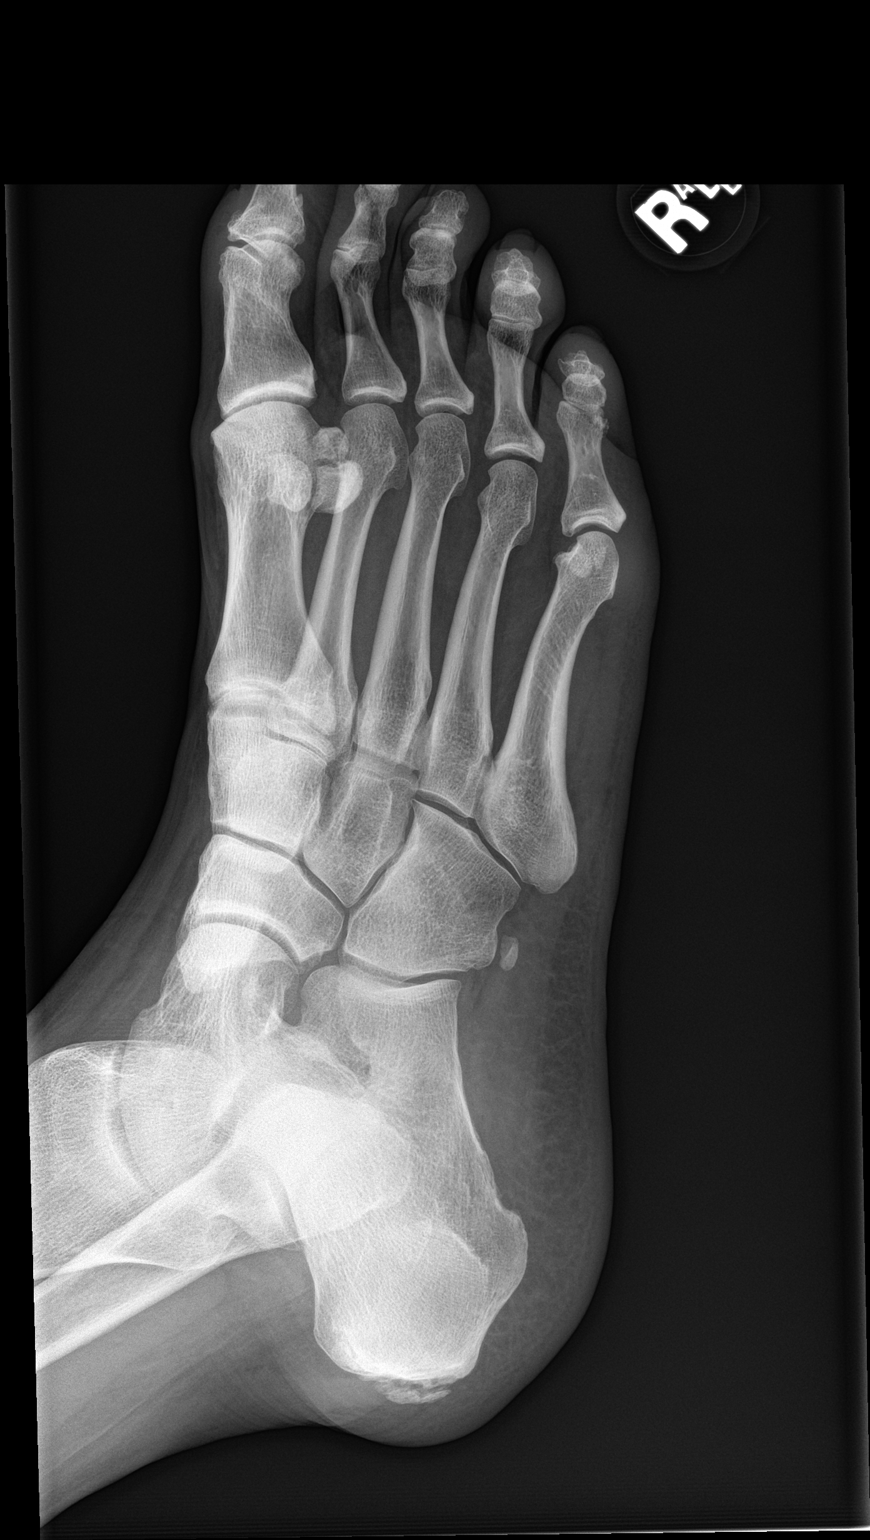

[foot lat]
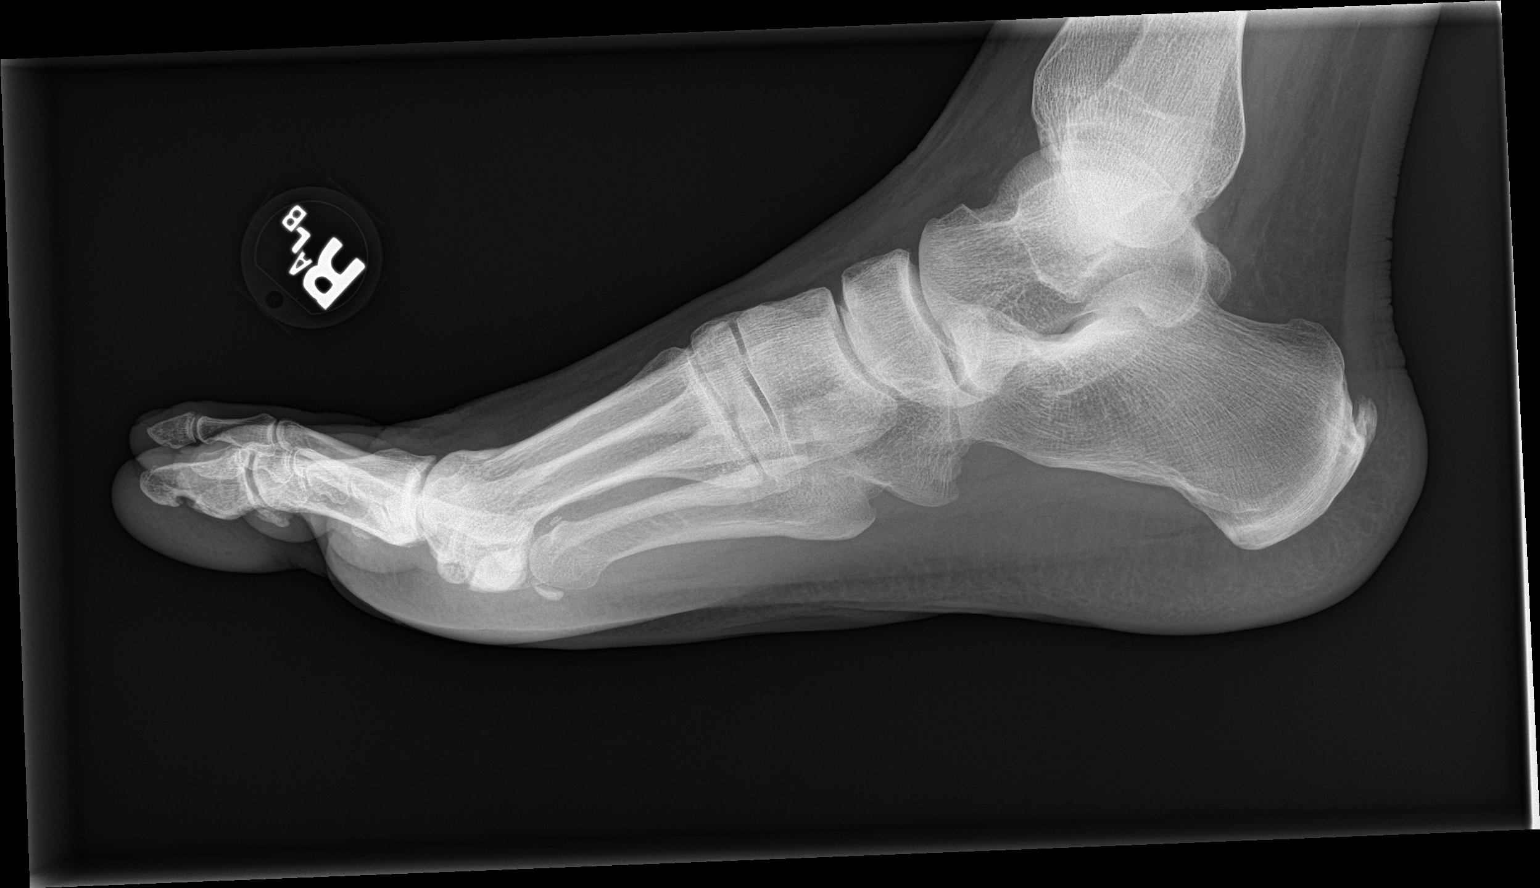

[3 of 3 positions shown; findings below may reference images not displayed]

FINDINGS: Small posterior calcaneal spur at the Achilles tendon insertion. No
acute bony abnormality. Specifically, no fracture, subluxation, or
dislocation. Soft tissues are intact. Early joint space narrowing
and spurring in the first MTP joint.
IMPRESSION: No acute bony abnormality. Small spur along the posterior calcaneus.
